# Patient Record
Sex: Male | Born: 1950 | Race: Black or African American | Hispanic: No | Marital: Married | Smoking: Former smoker
Health system: Southern US, Community
[De-identification: ages and names within clinical notes are randomized; demographics above are authoritative.]

## PROBLEM LIST (undated history)

## (undated) DIAGNOSIS — E7800 Pure hypercholesterolemia, unspecified: Secondary | ICD-10-CM

## (undated) HISTORY — PX: HYDROCELE EXCISION: SHX482

---

## 2020-04-23 ENCOUNTER — Other Ambulatory Visit: Payer: Self-pay

## 2020-04-23 ENCOUNTER — Encounter (HOSPITAL_BASED_OUTPATIENT_CLINIC_OR_DEPARTMENT_OTHER): Payer: Self-pay

## 2020-04-23 ENCOUNTER — Emergency Department (HOSPITAL_BASED_OUTPATIENT_CLINIC_OR_DEPARTMENT_OTHER): Payer: Medicare Other

## 2020-04-23 DIAGNOSIS — R05 Cough: Secondary | ICD-10-CM | POA: Diagnosis present

## 2020-04-23 DIAGNOSIS — Z87891 Personal history of nicotine dependence: Secondary | ICD-10-CM | POA: Insufficient documentation

## 2020-04-23 DIAGNOSIS — U071 COVID-19: Secondary | ICD-10-CM | POA: Diagnosis not present

## 2020-04-23 LAB — SARS CORONAVIRUS 2 BY RT PCR (HOSPITAL ORDER, PERFORMED IN ~~LOC~~ HOSPITAL LAB): SARS Coronavirus 2: POSITIVE — AB

## 2020-04-23 NOTE — ED Triage Notes (Addendum)
Pt c/o flu like sx day 3-no covid vaccine-last tylenol at 1030pm-NAD-to triage in w/c

## 2020-04-24 ENCOUNTER — Emergency Department (HOSPITAL_BASED_OUTPATIENT_CLINIC_OR_DEPARTMENT_OTHER)
Admission: EM | Admit: 2020-04-24 | Discharge: 2020-04-24 | Disposition: A | Discharge status: Home / Self Care | Payer: Medicare Other | Attending: Emergency Medicine | Admitting: Emergency Medicine

## 2020-04-24 ENCOUNTER — Encounter (HOSPITAL_COMMUNITY): Payer: Self-pay | Admitting: Oncology

## 2020-04-24 DIAGNOSIS — U071 COVID-19: Secondary | ICD-10-CM

## 2020-04-24 HISTORY — DX: Pure hypercholesterolemia, unspecified: E78.00

## 2020-04-24 NOTE — Progress Notes (Signed)
Called to Discuss with patient about Covid symptoms and the use of regeneron, a monoclonal antibody infusion for those with mild to moderate Covid symptoms and at a high risk of hospitalization.     Pt is qualified for this infusion at the [facility] infusion center due to co-morbid conditions and/or a member of an at-risk group.     Spoke to both patient and wife who would like to think about it but are very interested.  Will call back at 3 PM today.  Mignon Pine, NP, AGNP-C [phone] (Infusion Center Hotline)

## 2020-04-24 NOTE — ED Provider Notes (Signed)
[facility] Provider Note: Lowella Dell, MD, FACEP  CSN: [id number] MRN: [id number] ARRIVAL: 04/23/20 at 2249 ROOM: [facility]05/[facility]05   CHIEF COMPLAINT  Cough   HISTORY OF PRESENT ILLNESS  04/24/20 4:32 AM Scott Moreno is a 69 y.o. male with 3 days of fever, weakness and cough.  Symptoms are moderate.  His fever has been as high as 102 and he has been treating it with Tylenol.  He denies nasal congestion, sore throat, shortness of breath, body aches, loss of taste or smell, nausea, vomiting or diarrhea.   Past Medical History:  Diagnosis Date  . High cholesterol     Past Surgical History:  Procedure Laterality Date  . HYDROCELE EXCISION      No family history on file.  Social History   Tobacco Use  . Smoking status: Former Games developer  . Smokeless tobacco: Never Used  Vaping Use  . Vaping Use: Never used  Substance Use Topics  . Alcohol use: Never  . Drug use: Never    Prior to Admission medications   Not on File    Allergies Patient has no known allergies.   REVIEW OF SYSTEMS  Negative except as noted here or in the History of Present Illness.   PHYSICAL EXAMINATION  Initial Vital Signs Blood pressure 128/76, pulse 80, temperature 98.8 F (37.1 C), temperature source Oral, resp. rate 20, SpO2 95 %.  Examination General: Well-developed, well-nourished male in no acute distress; appearance consistent with age of record HENT: normocephalic; atraumatic Eyes: pupils equal, round and reactive to light; extraocular muscles intact; arcus senilis bilaterally Neck: supple Heart: regular rate and rhythm Lungs: clear to auscultation bilaterally Abdomen: soft; nondistended; nontender; bowel sounds present Extremities: No deformity; full range of motion; pulses normal Neurologic: Awake, alert and oriented; motor function intact in all extremities and symmetric; no facial droop Skin: Warm and dry Psychiatric: Normal mood and affect   RESULTS  Summary of  this visit's results, reviewed and interpreted by myself:   EKG Interpretation  Date/Time:    Ventricular Rate:    PR Interval:    QRS Duration:   QT Interval:    QTC Calculation:   R Axis:     Text Interpretation:        Laboratory Studies: Results for orders placed or performed during the hospital encounter of 04/24/20 (from the past 24 hour(s))  SARS Coronavirus 2 by RT PCR (hospital order, performed in [facility] hospital lab) Nasopharyngeal Nasopharyngeal Swab     Status: Abnormal   Collection Time: 04/23/20 11:06 PM   Specimen: Nasopharyngeal Swab  Result Value Ref Range   SARS Coronavirus 2 POSITIVE (A) NEGATIVE   Imaging Studies: DG Chest Portable 1 View  Result Date: 04/23/2020 CLINICAL DATA:  Flu-like symptoms for 3 days.  No COVID vaccine. EXAM: PORTABLE CHEST 1 VIEW COMPARISON:  11/27/2014 FINDINGS: The heart size and mediastinal contours are within normal limits. Both lungs are clear. The visualized skeletal structures are unremarkable. IMPRESSION: No active disease. Electronically Signed   By: Burman Nieves M.D.   On: 04/23/2020 23:26    ED COURSE and MDM  Nursing notes, initial and subsequent vitals signs, including pulse oximetry, reviewed and interpreted by myself.  Vitals:   04/23/20 2301 04/24/20 0230 04/24/20 0239  BP: 126/73 128/76   Pulse: 93 80   Resp: 20    Temp: (!) 100.7 F (38.2 C)  98.8 F (37.1 C)  TempSrc: Oral  Oral  SpO2: 95% 95%  Medications - No data to display  Patient advised of his Covid positive status and need to quarantine himself per CDC guidelines.  His chest x-ray is clear.  He was advised to return for difficulty breathing as this could be a sign of a more severe variant of the disease.  PROCEDURES  Procedures   ED DIAGNOSES     ICD-10-CM   1. COVID-19 virus infection  U07.1        Azad Calame, MD 04/24/20 [phone]

## 2020-04-25 ENCOUNTER — Other Ambulatory Visit (HOSPITAL_COMMUNITY): Payer: Self-pay | Admitting: Oncology

## 2020-04-25 DIAGNOSIS — U071 COVID-19: Secondary | ICD-10-CM

## 2020-04-25 MED ORDER — SODIUM CHLORIDE 0.9 % IV SOLN
1200.0000 mg | Freq: Once | INTRAVENOUS | Status: AC
  Administered 2020-04-26: 1200 mg via INTRAVENOUS
  Filled 2020-04-25: qty 1200

## 2020-04-25 NOTE — Progress Notes (Signed)
I connected by phone with  Scott Moreno on 05/06/2020 at 09:30 am to discuss the potential use of an new treatment for mild to moderate COVID-19 viral infection in non-hospitalized patients.   This patient is a age/sex that meets the FDA criteria for Emergency Use Authorization of casirivimab\imdevimab.  Has a (+) direct SARS-CoV-2 viral test result 1. Has mild or moderate COVID-19  2. Is ? 71 years of age and weighs ? 40 kg 3. Is NOT hospitalized due to COVID-19 4. Is NOT requiring oxygen therapy or requiring an increase in baseline oxygen flow rate due to COVID-19 5. Is within 10 days of symptom onset 6. Has at least one of the high risk factor(s) for progression to severe COVID-19 and/or hospitalization as defined in EUA. ? Specific high risk criteria :Age    Symptom onset  04/21/2020.    I have spoken and communicated the following to the patient or parent/caregiver:   1. FDA has authorized the emergency use of casirivimab\imdevimab for the treatment of mild to moderate COVID-19 in adults and pediatric patients with positive results of direct SARS-CoV-2 viral testing who are 44 years of age and older weighing at least 40 kg, and who are at high risk for progressing to severe COVID-19 and/or hospitalization.   2. The significant known and potential risks and benefits of casirivimab\imdevimab, and the extent to which such potential risks and benefits are unknown.   3. Information on available alternative treatments and the risks and benefits of those alternatives, including clinical trials.   4. Patients treated with casirivimab\imdevimab should continue to self-isolate and use infection control measures (e.g., wear mask, isolate, social distance, avoid sharing personal items, clean and disinfect "high touch" surfaces, and frequent handwashing) according to CDC guidelines.    5. The patient or parent/caregiver has the option to accept or refuse casirivimab\imdevimab .   After reviewing this  information with the patient, The patient agreed to proceed with receiving casirivimab\imdevimab infusion and will be provided a copy of the Fact sheet prior to receiving the infusion.Mignon Pine, AGNP-C [phone] (Infusion Center Hotline)

## 2020-04-26 ENCOUNTER — Ambulatory Visit (HOSPITAL_COMMUNITY)
Admission: RE | Admit: 2020-04-26 | Discharge: 2020-04-26 | Disposition: A | Discharge status: Home / Self Care | Payer: Medicare Other | Source: Ambulatory Visit | Attending: Pulmonary Disease | Admitting: Pulmonary Disease

## 2020-04-26 DIAGNOSIS — U071 COVID-19: Secondary | ICD-10-CM | POA: Diagnosis present

## 2020-04-26 DIAGNOSIS — Z23 Encounter for immunization: Secondary | ICD-10-CM | POA: Insufficient documentation

## 2020-04-26 MED ORDER — EPINEPHRINE 0.3 MG/0.3ML IJ SOAJ: 0.3000 mg | Freq: Once | INTRAMUSCULAR | Status: DC | PRN

## 2020-04-26 MED ORDER — SODIUM CHLORIDE 0.9 % IV SOLN: INTRAVENOUS | Status: DC | PRN

## 2020-04-26 MED ORDER — METHYLPREDNISOLONE SODIUM SUCC 125 MG IJ SOLR: 125.0000 mg | Freq: Once | INTRAMUSCULAR | Status: DC | PRN

## 2020-04-26 MED ORDER — ALBUTEROL SULFATE HFA 108 (90 BASE) MCG/ACT IN AERS: 2.0000 | INHALATION_SPRAY | Freq: Once | RESPIRATORY_TRACT | Status: DC | PRN

## 2020-04-26 MED ORDER — FAMOTIDINE IN NACL 20-0.9 MG/50ML-% IV SOLN: 20.0000 mg | Freq: Once | INTRAVENOUS | Status: DC | PRN

## 2020-04-26 MED ORDER — DIPHENHYDRAMINE HCL 50 MG/ML IJ SOLN: 50.0000 mg | Freq: Once | INTRAMUSCULAR | Status: DC | PRN

## 2020-04-26 NOTE — Discharge Instructions (Signed)

## 2020-04-26 NOTE — Progress Notes (Signed)
°  Diagnosis: COVID-19  Physician:DR Delford Field  Procedure: [facility]fusion [facility] Med: casirivimab\imdevimab infusion - Provided patient with casirivimab\imdevimab fact sheet for patients, parents and caregivers prior to infusion.  Complications: No immediate complications noted.  Discharge: Discharged home   Reginia Forts Albarece 04/26/2020

## 2021-04-07 IMAGING — DX DG CHEST 1V PORT
2 series · 2 of 2 positions shown · non-contrast
Comparison: 11/27/2014

CLINICAL DATA: Flu-like symptoms for 3 days.  No COVID vaccine.

EXAM:
PORTABLE CHEST 1 VIEW

[chest ap (1 of 2)]
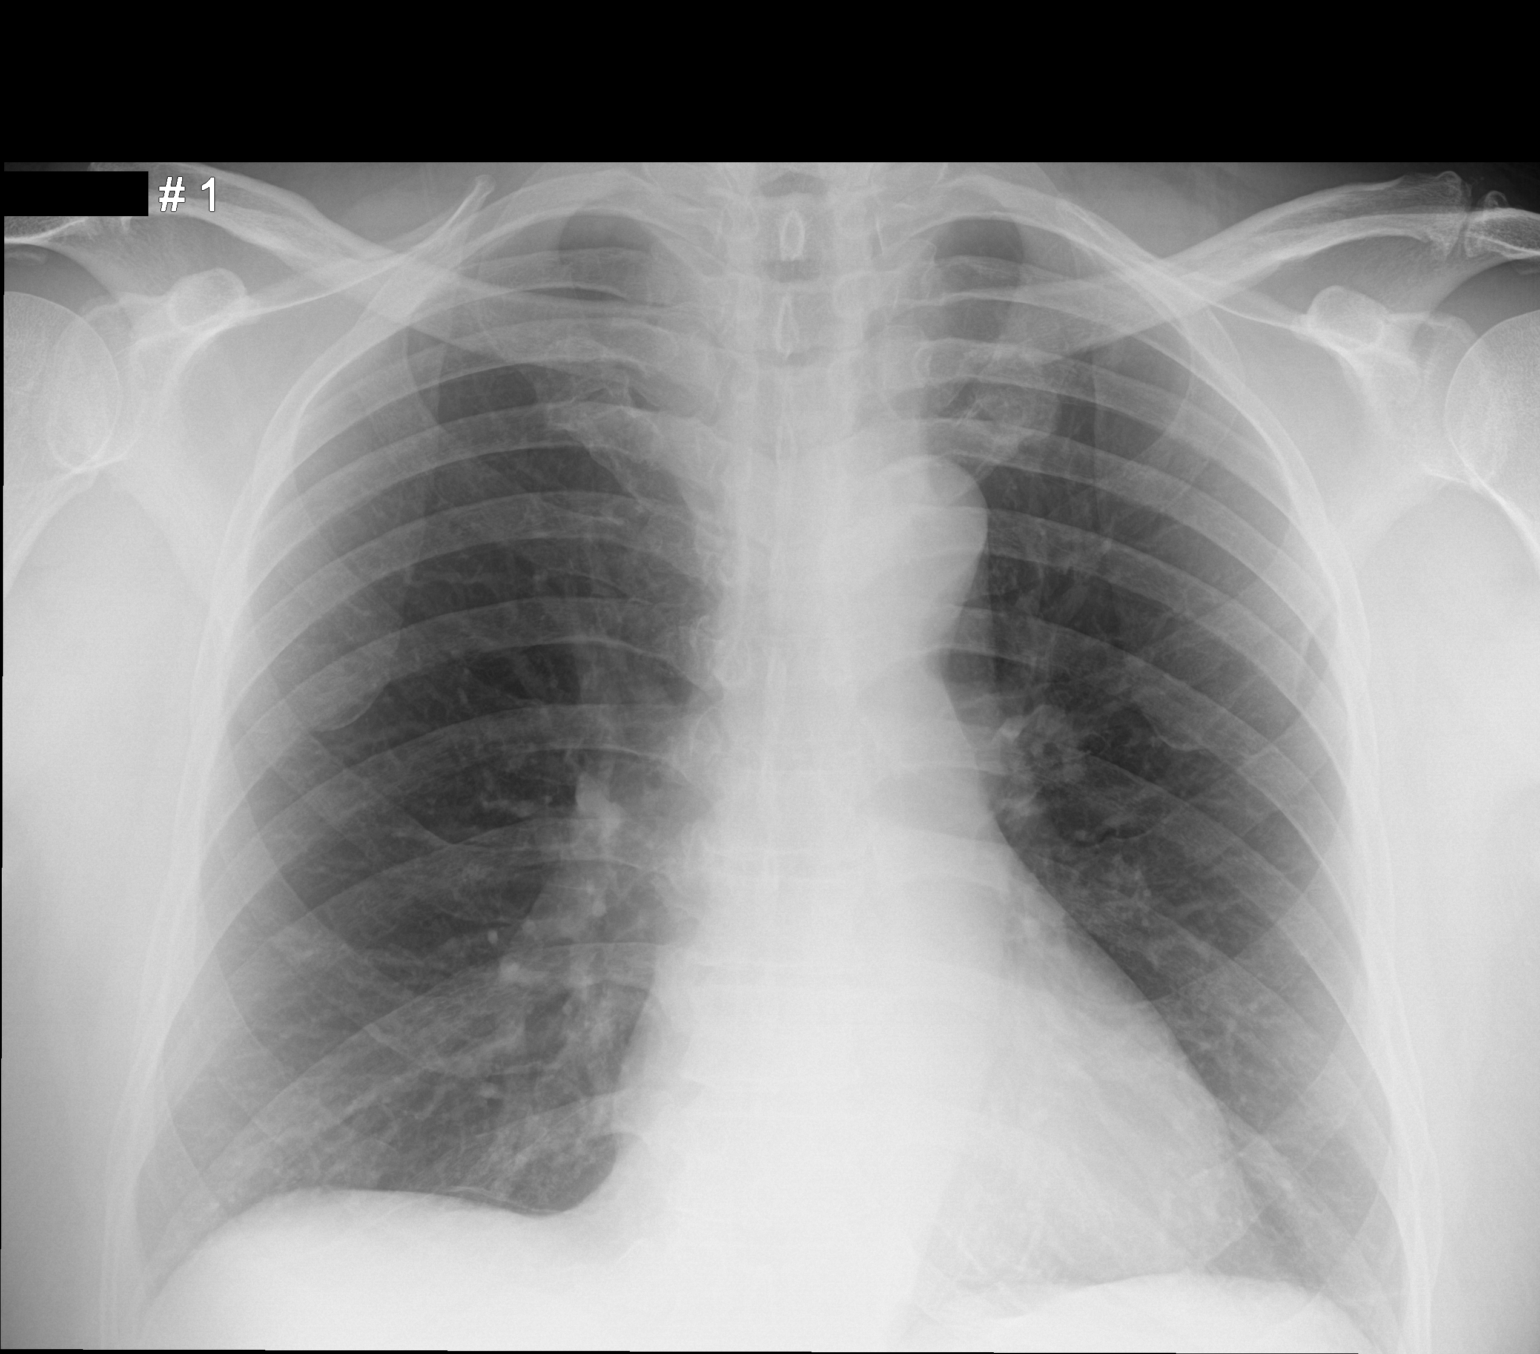

[chest ap (2 of 2)]
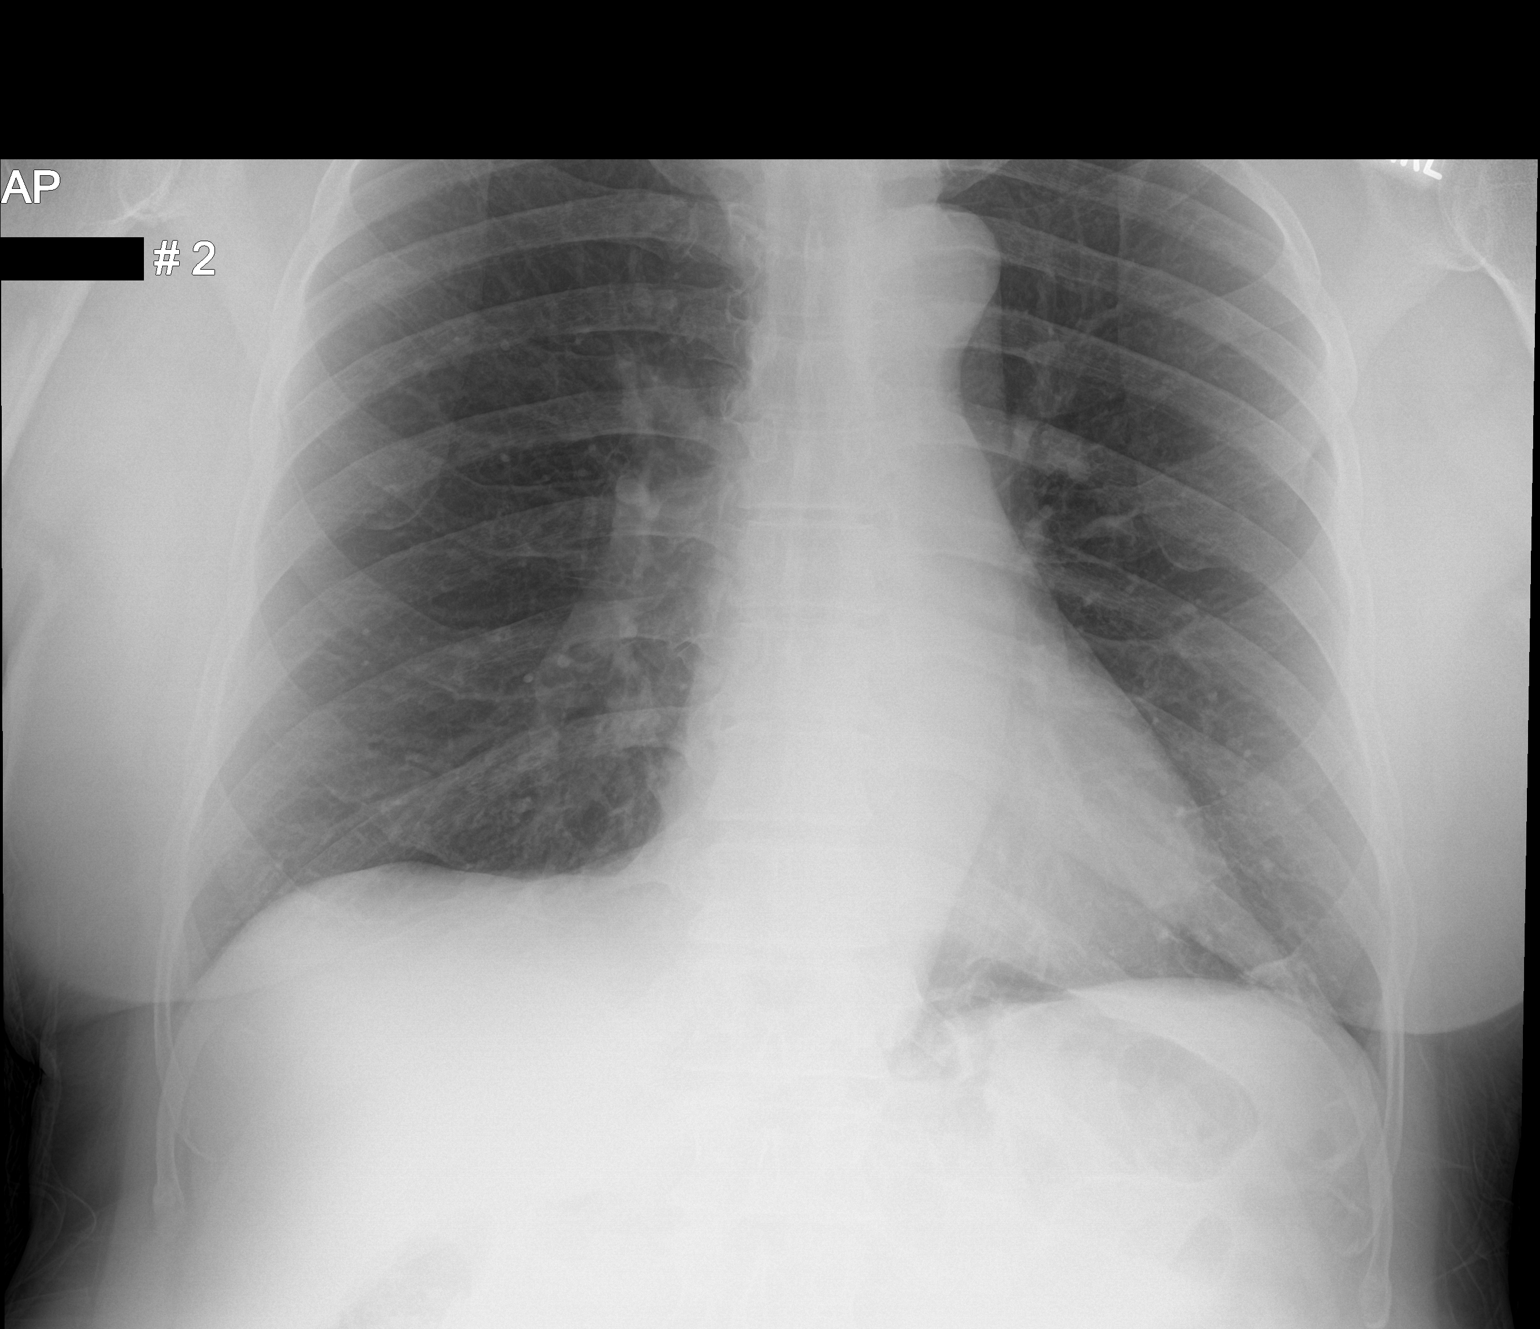

[2 of 2 positions shown; findings below may reference images not displayed]

FINDINGS: The heart size and mediastinal contours are within normal limits.
Both lungs are clear. The visualized skeletal structures are
unremarkable.
IMPRESSION: No active disease.

## 2023-10-18 ENCOUNTER — Emergency Department (HOSPITAL_BASED_OUTPATIENT_CLINIC_OR_DEPARTMENT_OTHER): Payer: Medicare Other

## 2023-10-18 ENCOUNTER — Encounter (HOSPITAL_BASED_OUTPATIENT_CLINIC_OR_DEPARTMENT_OTHER): Payer: Self-pay | Admitting: Emergency Medicine

## 2023-10-18 ENCOUNTER — Emergency Department (HOSPITAL_BASED_OUTPATIENT_CLINIC_OR_DEPARTMENT_OTHER)
Admission: EM | Admit: 2023-10-18 | Discharge: 2023-10-18 | Disposition: A | Discharge status: Home / Self Care | Payer: Medicare Other | Attending: Emergency Medicine | Admitting: Emergency Medicine

## 2023-10-18 ENCOUNTER — Other Ambulatory Visit: Payer: Self-pay

## 2023-10-18 DIAGNOSIS — R059 Cough, unspecified: Secondary | ICD-10-CM | POA: Diagnosis present

## 2023-10-18 DIAGNOSIS — J101 Influenza due to other identified influenza virus with other respiratory manifestations: Secondary | ICD-10-CM | POA: Insufficient documentation

## 2023-10-18 DIAGNOSIS — Z20822 Contact with and (suspected) exposure to covid-19: Secondary | ICD-10-CM | POA: Diagnosis not present

## 2023-10-18 LAB — RESP PANEL BY RT-PCR (RSV, FLU A&B, COVID)  RVPGX2
Influenza A by PCR: POSITIVE — AB
Influenza B by PCR: NEGATIVE
Resp Syncytial Virus by PCR: NEGATIVE
SARS Coronavirus 2 by RT PCR: NEGATIVE

## 2023-10-18 MED ORDER — IBUPROFEN 800 MG PO TABS
800.0000 mg | ORAL_TABLET | Freq: Once | ORAL | Status: AC
  Administered 2023-10-18: 800 mg via ORAL
  Filled 2023-10-18: qty 1

## 2023-10-18 MED ORDER — OSELTAMIVIR PHOSPHATE 75 MG PO CAPS
75.0000 mg | ORAL_CAPSULE | Freq: Two times a day (BID) | ORAL | 0 refills | Status: AC
Start: 2023-10-18 — End: ?

## 2023-10-18 NOTE — ED Triage Notes (Signed)
Pt c/o fatigue, cough, congestion x 3d

## 2023-10-18 NOTE — ED Provider Notes (Signed)
[facility] Provider Note   CSN: [id number] Arrival date & time: 10/18/23  1240     History  Chief Complaint  Patient presents with   Fatigue    Scott Moreno is a 73 y.o. male.  Pt is a 73 yo male with arthritis and hld.  Pt said he has had cough, congestion, and fatigue for the past 3 days.  He has not taken any meds for his sx.  His wife is also ill.         Home Medications Prior to Admission medications   Medication Sig Start Date End Date Taking? Authorizing Provider  oseltamivir (TAMIFLU) 75 MG capsule Take 1 capsule (75 mg total) by mouth every 12 (twelve) hours. 10/18/23  Yes Jacalyn Lefevre, MD      Allergies    Patient has no known allergies.    Review of Systems   Review of Systems  Constitutional:  Positive for fatigue.  HENT:  Positive for congestion.   Respiratory:  Positive for cough.   All other systems reviewed and are negative.   Physical Exam Updated Vital Signs BP 127/86 (BP Location: Left Arm)   Pulse (!) 107   Temp 99.8 F (37.7 C)   Resp 18   Ht 5\' 6"  (1.676 m)   Wt 86.2 kg   SpO2 95%   BMI 30.67 kg/m  Physical Exam Vitals reviewed.  Constitutional:      Appearance: Normal appearance.  HENT:     Head: Normocephalic and atraumatic.     Right Ear: External ear normal.     Left Ear: External ear normal.     Nose: Nose normal.     Mouth/Throat:     Mouth: Mucous membranes are moist.     Pharynx: Oropharynx is clear.  Eyes:     Extraocular Movements: Extraocular movements intact.     Conjunctiva/sclera: Conjunctivae normal.     Pupils: Pupils are equal, round, and reactive to light.  Cardiovascular:     Rate and Rhythm: Normal rate and regular rhythm.     Pulses: Normal pulses.     Heart sounds: Normal heart sounds.  Pulmonary:     Effort: Pulmonary effort is normal.     Breath sounds: Normal breath sounds.  Abdominal:     General: Abdomen is flat. Bowel sounds are normal.      Palpations: Abdomen is soft.  Musculoskeletal:        General: Normal range of motion.     Cervical back: Normal range of motion and neck supple.  Skin:    General: Skin is warm.     Capillary Refill: Capillary refill takes less than 2 seconds.  Neurological:     General: No focal deficit present.     Mental Status: He is alert and oriented to person, place, and time.  Psychiatric:        Mood and Affect: Mood normal.        Behavior: Behavior normal.     ED Results / Procedures / Treatments   Labs (all labs ordered are listed, but only abnormal results are displayed) Labs Reviewed  RESP PANEL BY RT-PCR (RSV, FLU A&B, COVID)  [id number] - Abnormal; Notable for the following components:      Result Value   Influenza A by PCR POSITIVE (*)    All other components within normal limits    EKG None  Radiology DG Chest 2 View Result Date: 10/18/2023 CLINICAL DATA:  Cough. EXAM: CHEST - 2 VIEW COMPARISON:  Chest radiograph dated 08/23/2023. FINDINGS: No focal consolidation, pleural effusion, or pneumothorax. Bibasilar streaky atelectasis. The cardiac silhouette is within normal limits. No acute osseous pathology. IMPRESSION: No active cardiopulmonary disease. Electronically Signed   By: Elgie Collard M.D.   On: 10/18/2023 14:54    Procedures Procedures    Medications Ordered in ED Medications  ibuprofen (ADVIL) tablet 800 mg (800 mg Oral Given 10/18/23 1544)    ED Course/ Medical Decision Making/ A&P                                 Medical Decision Making Amount and/or Complexity of Data Reviewed Radiology: ordered.  Risk Prescription drug management.   This patient presents to the ED for concern of cough/congestion, this involves an extensive number of treatment options, and is a complaint that carries with it a high risk of complications and morbidity.  The differential diagnosis includes covid/flu/rsv, pna   Co morbidities that complicate the patient  evaluation  Arthritis and hld   Additional history obtained:  Additional history obtained from epic chart review External records from outside source obtained and reviewed including son   Lab Tests:  I Ordered, and personally interpreted labs.  The pertinent results include:  covid/rsv neg; flu a +   Imaging Studies ordered:  I ordered imaging studies including cxr  I independently visualized and interpreted imaging which showed No active cardiopulmonary disease.  I agree with the radiologist interpretation   Medicines ordered and prescription drug management:  I ordered medication including ibuprofen  for sx  Reevaluation of the patient after these medicines showed that the patient improved I have reviewed the patients home medicines and have made adjustments as needed   Test Considered:  cxr   Problem List / ED Course:  Influenza A:  pt is not hypoxic.  He has no pna.  He is stable for d/c.  Return if worse.  F/u with pcp.   Reevaluation:  After the interventions noted above, I reevaluated the patient and found that they have :improved   Social Determinants of Health:  Lives at home   Dispostion:  After consideration of the diagnostic results and the patients response to treatment, I feel that the patent would benefit from discharge with outpatient f/u.          Final Clinical Impression(s) / ED Diagnoses Final diagnoses:  Influenza A    Rx / DC Orders ED Discharge Orders          Ordered    oseltamivir (TAMIFLU) 75 MG capsule  Every 12 hours        10/18/23 1536              Jacalyn Lefevre, MD 10/18/23 1636
